# Patient Record
Sex: Female | Born: 2000 | Race: White | Hispanic: No | Marital: Single | Smoking: Never smoker
Health system: Southern US, Community
[De-identification: ages and names within clinical notes are randomized; demographics above are authoritative.]

## PROBLEM LIST (undated history)

## (undated) DIAGNOSIS — F938 Other childhood emotional disorders: Secondary | ICD-10-CM

## (undated) DIAGNOSIS — F909 Attention-deficit hyperactivity disorder, unspecified type: Secondary | ICD-10-CM

## (undated) HISTORY — PX: MOUTH SURGERY: SHX715

---
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
## 2018-08-01 DIAGNOSIS — F902 Attention-deficit hyperactivity disorder, combined type: Secondary | ICD-10-CM | POA: Diagnosis not present

## 2018-09-18 DIAGNOSIS — F902 Attention-deficit hyperactivity disorder, combined type: Secondary | ICD-10-CM | POA: Diagnosis not present

## 2018-10-24 ENCOUNTER — Other Ambulatory Visit: Payer: Self-pay

## 2018-10-24 DIAGNOSIS — Z20822 Contact with and (suspected) exposure to covid-19: Secondary | ICD-10-CM

## 2018-10-25 LAB — NOVEL CORONAVIRUS, NAA: SARS-CoV-2, NAA: NOT DETECTED

## 2018-10-25 LAB — SPECIMEN STATUS REPORT

## 2019-07-03 ENCOUNTER — Other Ambulatory Visit: Payer: Self-pay | Admitting: Obstetrics and Gynecology

## 2019-07-03 DIAGNOSIS — N6452 Nipple discharge: Secondary | ICD-10-CM

## 2019-07-16 ENCOUNTER — Ambulatory Visit
Admission: RE | Admit: 2019-07-16 | Discharge: 2019-07-16 | Disposition: A | Discharge status: Home / Self Care | Payer: BC Managed Care – PPO | Source: Ambulatory Visit | Attending: Obstetrics and Gynecology | Admitting: Obstetrics and Gynecology

## 2019-07-16 ENCOUNTER — Other Ambulatory Visit: Payer: Self-pay | Admitting: Obstetrics and Gynecology

## 2019-07-16 ENCOUNTER — Other Ambulatory Visit: Payer: Self-pay

## 2019-07-16 DIAGNOSIS — N6452 Nipple discharge: Secondary | ICD-10-CM

## 2019-07-18 ENCOUNTER — Other Ambulatory Visit: Payer: Self-pay | Admitting: Obstetrics and Gynecology

## 2019-07-18 ENCOUNTER — Other Ambulatory Visit: Payer: Self-pay

## 2019-07-18 DIAGNOSIS — N6452 Nipple discharge: Secondary | ICD-10-CM

## 2020-02-24 ENCOUNTER — Other Ambulatory Visit: Payer: BC Managed Care – PPO

## 2020-02-24 DIAGNOSIS — Z20822 Contact with and (suspected) exposure to covid-19: Secondary | ICD-10-CM

## 2020-02-26 LAB — SARS-COV-2, NAA 2 DAY TAT

## 2020-02-26 LAB — NOVEL CORONAVIRUS, NAA: SARS-CoV-2, NAA: DETECTED — AB

## 2020-10-27 IMAGING — US US BREAST*L* LIMITED INC AXILLA
1 series · 2 of 2 positions shown · non-contrast
Comparison: None.
COMPARISON: None.

Addendum:
CLINICAL DATA: Patient presents for unilateral left spontaneous
clear nipple discharge for the past 2 weeks.

EXAM:
DIGITAL DIAGNOSTIC BILATERAL MAMMOGRAM WITH CAD AND TOMO
ULTRASOUND LEFT BREAST

[Series 1: us breast*left* limited inc axilla · 0.06mm/px · 2 of 2 slices shown]
[im 1/2]
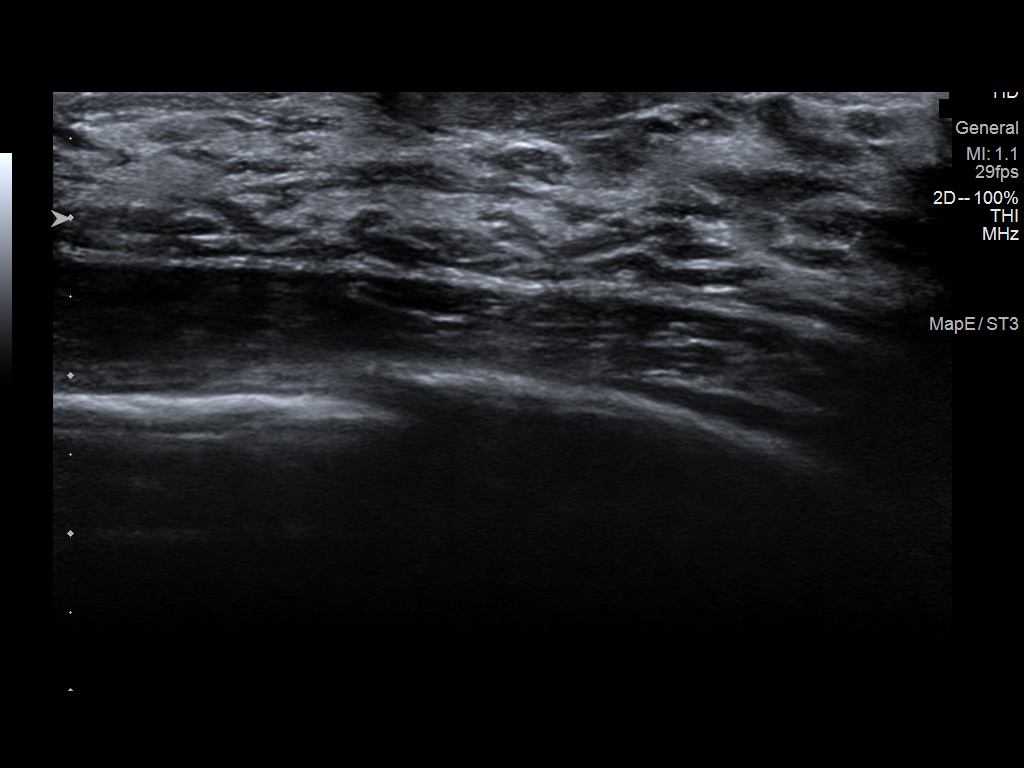
[im 2/2]
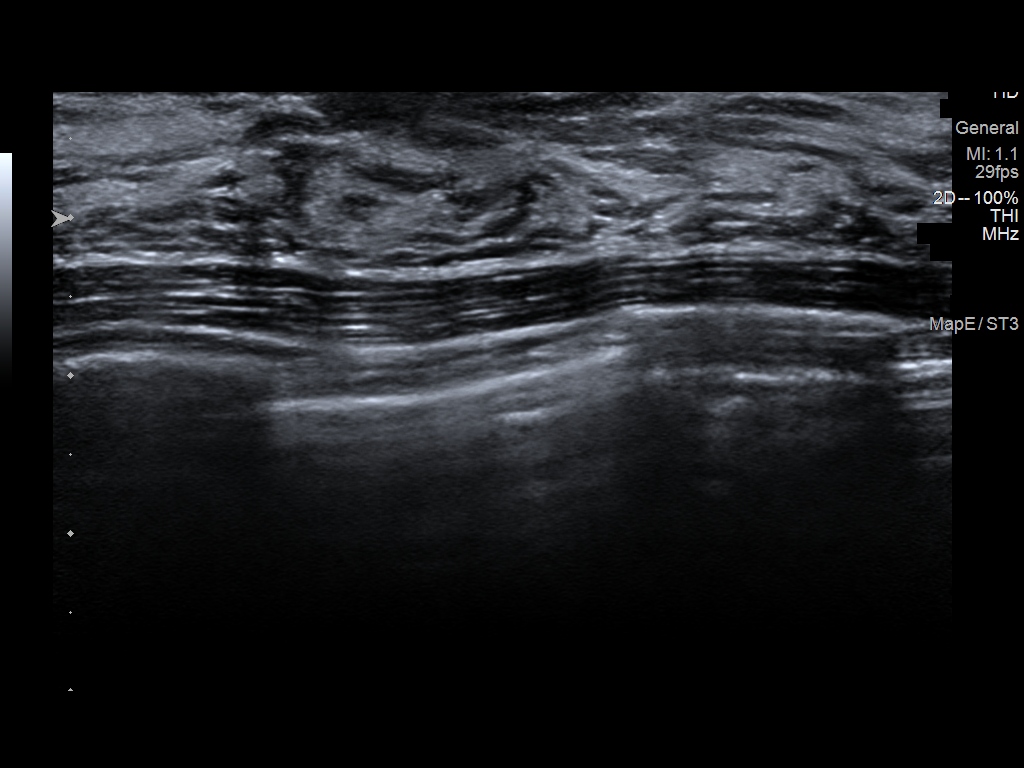

[2 of 2 positions shown; findings below may reference images not displayed]

ACR Breast Density Category d: The breast tissue is extremely dense,
which lowers the sensitivity of mammography.
FINDINGS: No concerning masses, calcifications or distortion identified within
either breast.

Mammographic images were processed with CAD.

On physical exam, there is a small amount of clear discharge
elicited with palpation from a single duct within the left nipple.

Targeted ultrasound is performed, showing normal retroareolar
tissues within the left breast. No suspicious retroareolar mass or
dilated ducts identified.
IMPRESSION: No mammographic evidence for malignancy.

Indeterminate suspicious spontaneous clear left nipple discharge.

No findings within the retroareolar left breast on ultrasound to
explain spontaneous clear nipple discharge.

RECOMMENDATION:
Bilateral breast MRI and surgical consultation for further clinical
evaluation of indeterminate clear spontaneous left nipple discharge.

I have discussed the findings and recommendations with the patient.
If applicable, a reminder letter will be sent to the patient
regarding the next appointment.

BI-RADS CATEGORY  2: Benign.

ADDENDUM:
Surgical consultation has been arranged with Dr. Salaj Perlesha at
[REDACTED] on August 06, 2019.

Bilateral breast MRI recommended for further clinical evaluation of
indeterminate clear spontaneous LEFT nipple discharge.

Biken Duenas RN on 07/17/2019

*** End of Addendum ***
ACR Breast Density Category d: The breast tissue is extremely dense,
which lowers the sensitivity of mammography.
FINDINGS: No concerning masses, calcifications or distortion identified within
either breast.

Mammographic images were processed with CAD.

On physical exam, there is a small amount of clear discharge
elicited with palpation from a single duct within the left nipple.

Targeted ultrasound is performed, showing normal retroareolar
tissues within the left breast. No suspicious retroareolar mass or
dilated ducts identified.
IMPRESSION: No mammographic evidence for malignancy.

Indeterminate suspicious spontaneous clear left nipple discharge.

No findings within the retroareolar left breast on ultrasound to
explain spontaneous clear nipple discharge.

RECOMMENDATION:
Bilateral breast MRI and surgical consultation for further clinical
evaluation of indeterminate clear spontaneous left nipple discharge.

I have discussed the findings and recommendations with the patient.
If applicable, a reminder letter will be sent to the patient
regarding the next appointment.

BI-RADS CATEGORY  2: Benign.

## 2020-10-27 IMAGING — MG DIGITAL DIAGNOSTIC BILAT W/ TOMO W/ CAD
6 of 10 series · 6 of 30 positions shown · non-contrast
Comparison: None.
COMPARISON: None.

Addendum:
CLINICAL DATA: Patient presents for unilateral left spontaneous
clear nipple discharge for the past 2 weeks.

EXAM:
DIGITAL DIAGNOSTIC BILATERAL MAMMOGRAM WITH CAD AND TOMO
ULTRASOUND LEFT BREAST

[L MLO synth-2D (1 of 2)]
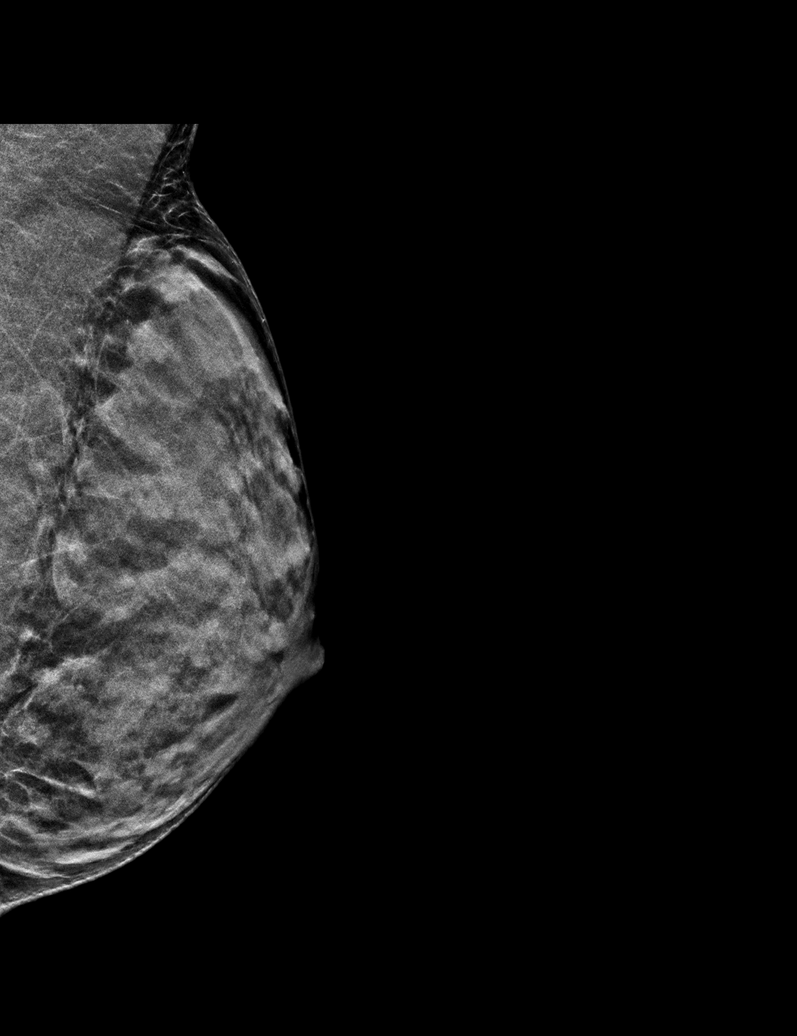

[R MLO synth-2D]
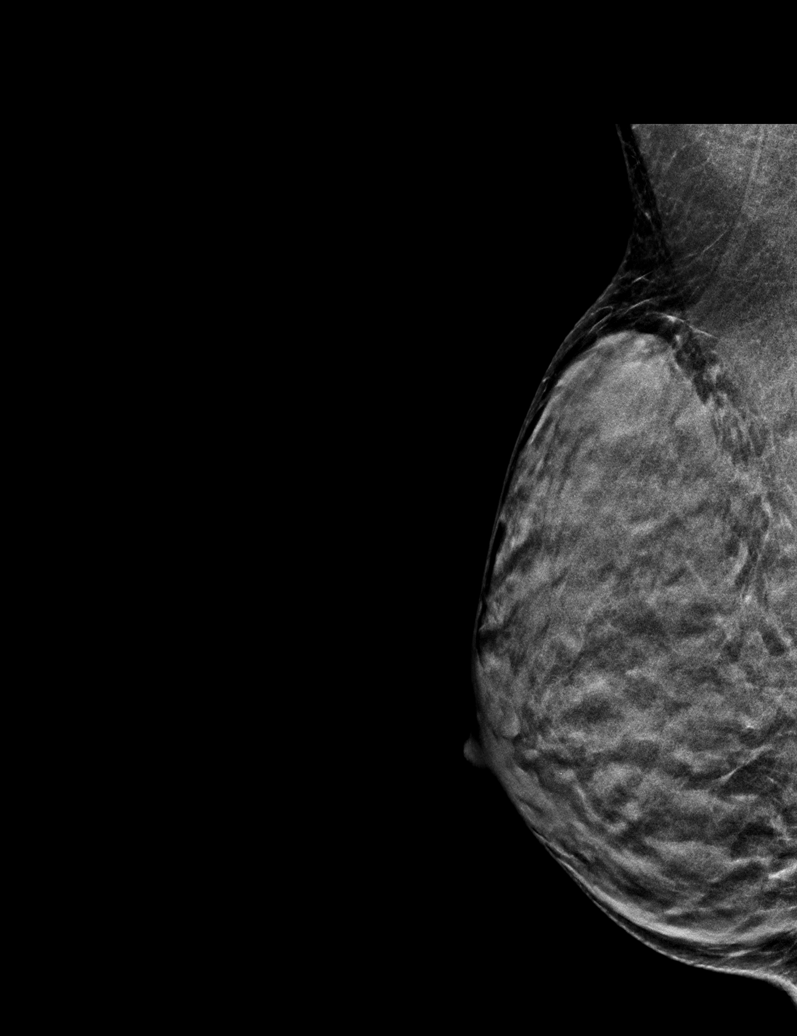

[L CC synth-2D]
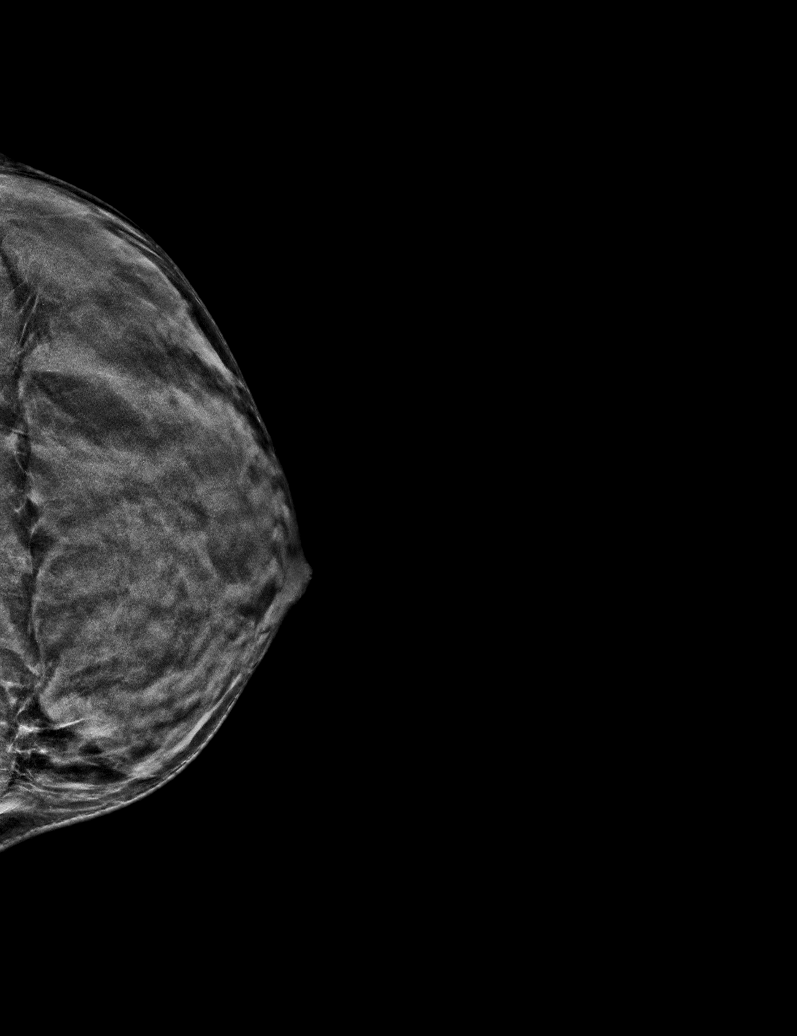

[L MLO synth-2D (2 of 2)]
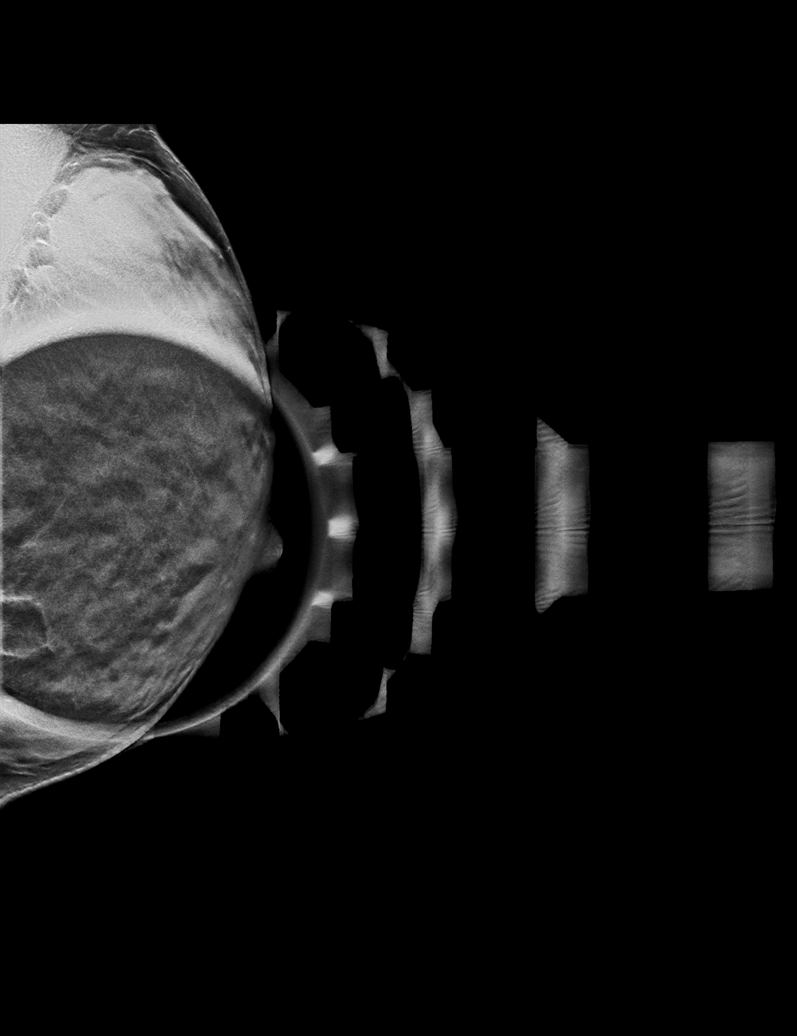

[R CC synth-2D]
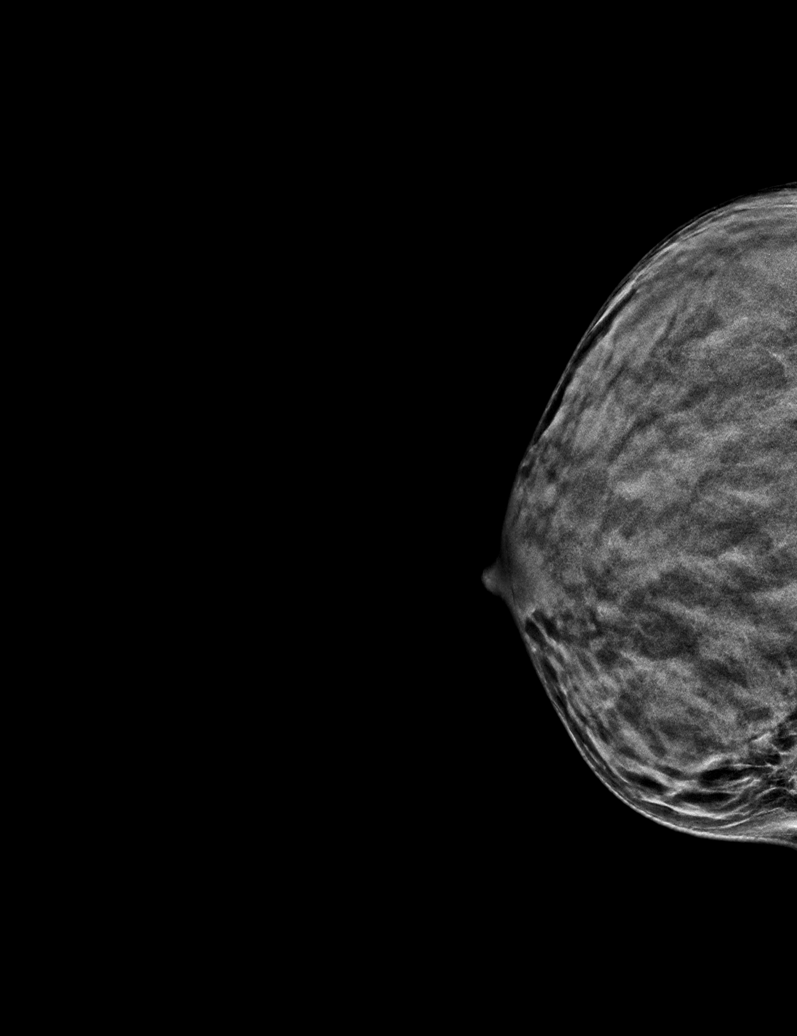

[L CC tomo · tomo slice 21/40.0]
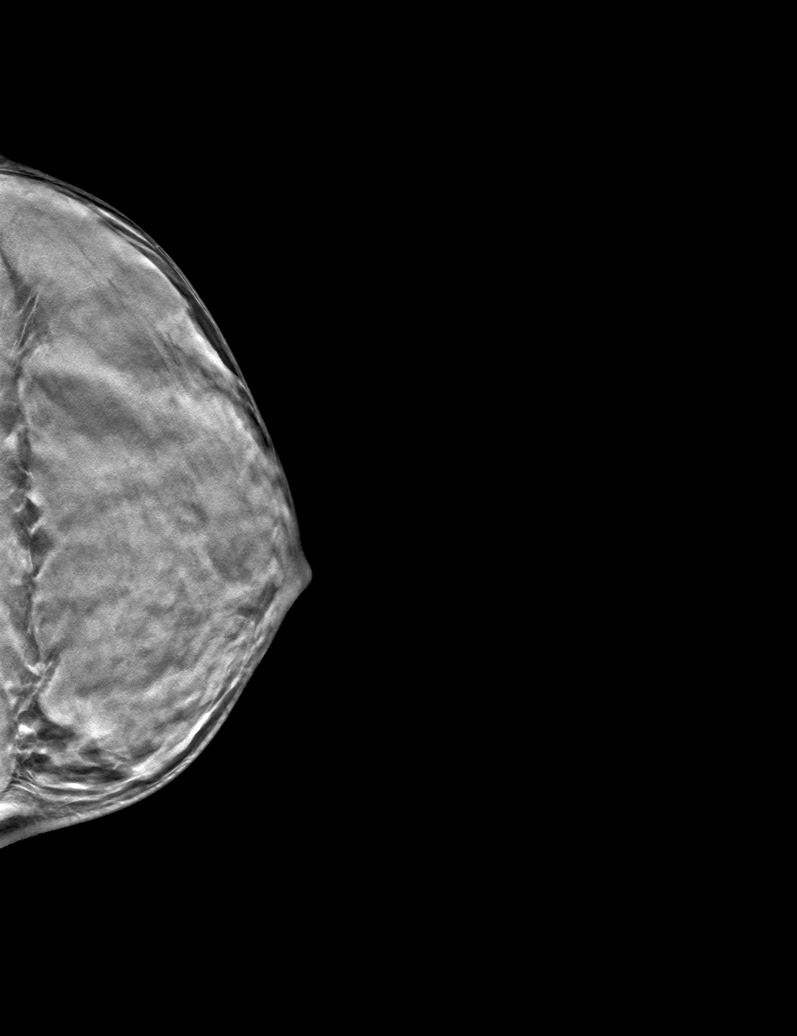

[6 of 30 positions shown; findings below may reference images not displayed]

ACR Breast Density Category d: The breast tissue is extremely dense,
which lowers the sensitivity of mammography.
FINDINGS: No concerning masses, calcifications or distortion identified within
either breast.

Mammographic images were processed with CAD.

On physical exam, there is a small amount of clear discharge
elicited with palpation from a single duct within the left nipple.

Targeted ultrasound is performed, showing normal retroareolar
tissues within the left breast. No suspicious retroareolar mass or
dilated ducts identified.
IMPRESSION: No mammographic evidence for malignancy.

Indeterminate suspicious spontaneous clear left nipple discharge.

No findings within the retroareolar left breast on ultrasound to
explain spontaneous clear nipple discharge.

RECOMMENDATION:
Bilateral breast MRI and surgical consultation for further clinical
evaluation of indeterminate clear spontaneous left nipple discharge.

I have discussed the findings and recommendations with the patient.
If applicable, a reminder letter will be sent to the patient
regarding the next appointment.

BI-RADS CATEGORY  2: Benign.

ADDENDUM:
Surgical consultation has been arranged with Dr. Salaj Perlesha at
[REDACTED] on August 06, 2019.

Bilateral breast MRI recommended for further clinical evaluation of
indeterminate clear spontaneous LEFT nipple discharge.

Biken Duenas RN on 07/17/2019

*** End of Addendum ***
ACR Breast Density Category d: The breast tissue is extremely dense,
which lowers the sensitivity of mammography.
FINDINGS: No concerning masses, calcifications or distortion identified within
either breast.

Mammographic images were processed with CAD.

On physical exam, there is a small amount of clear discharge
elicited with palpation from a single duct within the left nipple.

Targeted ultrasound is performed, showing normal retroareolar
tissues within the left breast. No suspicious retroareolar mass or
dilated ducts identified.
IMPRESSION: No mammographic evidence for malignancy.

Indeterminate suspicious spontaneous clear left nipple discharge.

No findings within the retroareolar left breast on ultrasound to
explain spontaneous clear nipple discharge.

RECOMMENDATION:
Bilateral breast MRI and surgical consultation for further clinical
evaluation of indeterminate clear spontaneous left nipple discharge.

I have discussed the findings and recommendations with the patient.
If applicable, a reminder letter will be sent to the patient
regarding the next appointment.

BI-RADS CATEGORY  2: Benign.

## 2021-12-22 ENCOUNTER — Other Ambulatory Visit (HOSPITAL_COMMUNITY): Payer: Self-pay | Admitting: Obstetrics and Gynecology

## 2021-12-22 DIAGNOSIS — O034 Incomplete spontaneous abortion without complication: Secondary | ICD-10-CM

## 2021-12-24 ENCOUNTER — Encounter (HOSPITAL_COMMUNITY): Payer: Self-pay | Admitting: Obstetrics and Gynecology

## 2021-12-24 NOTE — H&P (Addendum)
Mary Gonzales is an 21 y.o. female. Presenting with retained products of conception.  She has pregnancy diagnosed in our office on 11/30/21 with IUGS and YS. She underwent pregnancy termination at [facility] choice with pills rx-ed by then. She came to my office a few days after for [place] to confirm completion. It showed suspicion for rPOC. [place] was then repeated a week later and confirmed rPOCs with increased BF.  She has been on pelvic rest.   Pertinent Gynecological History:  OB History: G1, P0010  Patient's last menstrual period was 09/28/2021 (approximate).    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 No pertinent family history.  Social History:  reports that she has never smoked. She has never used smokeless tobacco. She reports current alcohol use of about 2.0 standard drinks of alcohol per week. She reports that she does not use drugs.  Allergies: No Known Allergies  No medications prior to admission.    Review of Systems  Height 5\' 6"  (1.676 m), weight 52.2 kg, last menstrual period 09/28/2021. Physical Exam Gen: well appearing, NAD CV: Reg rate Pulm: NWOB Abd: soft, nondistended, nontender, no masses GYN: uterus 6 week size, no adnexa ttp/CMT Ext: No edema b/l  No results found for this or any previous visit (from the past 24 hour(s)).  No results found.  Assessment/Plan: Pt counseled on TVUS demonstrating rPOCs.  Patient desires definitive surgical management with suction dilation and curettage.   Risks/benefits/ and alternatives reviewed with patient with risks including but not limited to bleeding, infection, uterine perforation, and damage to nearby structures such as the bowel, bladder, vessels, and/or other organs. She was given opportunity to ask  questions and all questions answered. Patient consents to proceed with suction dilation and curettage. Reviewed bleeding precautions. Her blood type is O+ and she does not need Rhogam. Given instructions and she verbalizes understanding. Discussed the importance of having at least one normal periods after completion of the process, before attempting conception again. Discussed S/S to call back for. All questions answered and pt verbalizes understanding w/out further questions/concerns. Risks discussed including infection, bleeding, damage to surrounding structures, need for additional procedures, postoperative DVT and subsequent scarring. All questions answered. Consent signed in office.  Plan on doxycycline postop and consider cytotec x 3 days.    Tyson Dense 12/24/2021, 12:33 PM

## 2021-12-24 NOTE — Progress Notes (Signed)
PCP - Dr Pershing Cox Cardiologist - n/a  Chest x-ray - n/a EKG - n/a Stress Test - n/a ECHO - n/a Cardiac Cath - n/a  ICD Pacemaker/Loop - n/a  Sleep Study -  n/a CPAP - none  STOP now taking any Aspirin (unless otherwise instructed by your surgeon), Aleve, Naproxen, Ibuprofen, Motrin, Advil, Goody's, BC's, all herbal medications, fish oil, and all vitamins.   Coronavirus Screening Do you have any of the following symptoms:  Cough yes/no: No Fever (>100.81F)  yes/no: No Runny nose yes/no: No Sore throat yes/no: No Difficulty breathing/shortness of breath  yes/no: No  Have you traveled in the last 14 days and where? yes/no: No  Patient verbalized understanding of instructions that were given via phone

## 2021-12-26 NOTE — Anesthesia Preprocedure Evaluation (Signed)
Anesthesia Evaluation  Patient identified by MRN, date of birth, ID band Patient awake    Reviewed: Allergy & Precautions, NPO status , Patient's Chart, lab work & pertinent test results  Airway Mallampati: I  TM Distance: >3 FB Neck ROM: Full    Dental no notable dental hx. (+) Dental Advisory Given, Teeth Intact   Pulmonary neg pulmonary ROS, Patient abstained from smoking.   Pulmonary exam normal breath sounds clear to auscultation       Cardiovascular negative cardio ROS Normal cardiovascular exam Rhythm:Regular Rate:Normal     Neuro/Psych  PSYCHIATRIC DISORDERS Anxiety Depression    negative neurological ROS     GI/Hepatic negative GI ROS, Neg liver ROS,,,  Endo/Other  negative endocrine ROS    Renal/GU negative Renal ROS     Musculoskeletal negative musculoskeletal ROS (+)    Abdominal   Peds  Hematology negative hematology ROS (+)   Anesthesia Other Findings   Reproductive/Obstetrics                             Anesthesia Physical Anesthesia Plan  ASA: 1  Anesthesia Plan: General   Post-op Pain Management: Tylenol PO (pre-op)* and Toradol IV (intra-op)*   Induction: Intravenous  PONV Risk Score and Plan: 4 or greater and Ondansetron, Dexamethasone, Midazolam, Treatment may vary due to age or medical condition and Scopolamine patch - Pre-op  Airway Management Planned: LMA  Additional Equipment:   Intra-op Plan:   Post-operative Plan: Extubation in OR  Informed Consent: I have reviewed the patients History and Physical, chart, labs and discussed the procedure including the risks, benefits and alternatives for the proposed anesthesia with the patient or authorized representative who has indicated his/her understanding and acceptance.     Dental advisory given  Plan Discussed with: CRNA  Anesthesia Plan Comments:         Anesthesia Quick Evaluation

## 2021-12-27 ENCOUNTER — Ambulatory Visit (HOSPITAL_COMMUNITY): Payer: BC Managed Care – PPO | Admitting: Anesthesiology

## 2021-12-27 ENCOUNTER — Ambulatory Visit (HOSPITAL_COMMUNITY)
Admission: RE | Admit: 2021-12-27 | Discharge: 2021-12-27 | Disposition: A | Discharge status: Home / Self Care | Payer: BC Managed Care – PPO | Attending: Obstetrics and Gynecology | Admitting: Obstetrics and Gynecology

## 2021-12-27 ENCOUNTER — Ambulatory Visit (HOSPITAL_COMMUNITY)
Admission: RE | Admit: 2021-12-27 | Discharge: 2021-12-27 | Disposition: A | Discharge status: Home / Self Care | Payer: BC Managed Care – PPO | Source: Ambulatory Visit | Attending: Obstetrics and Gynecology | Admitting: Obstetrics and Gynecology

## 2021-12-27 ENCOUNTER — Encounter (HOSPITAL_COMMUNITY)
Admission: RE | Disposition: A | Discharge status: Home / Self Care | Payer: Self-pay | Source: Home / Self Care | Attending: Obstetrics and Gynecology

## 2021-12-27 ENCOUNTER — Other Ambulatory Visit: Payer: Self-pay

## 2021-12-27 ENCOUNTER — Encounter (HOSPITAL_COMMUNITY): Payer: Self-pay | Admitting: Obstetrics and Gynecology

## 2021-12-27 DIAGNOSIS — O034 Incomplete spontaneous abortion without complication: Secondary | ICD-10-CM

## 2021-12-27 DIAGNOSIS — O074 Failed attempted termination of pregnancy without complication: Secondary | ICD-10-CM | POA: Diagnosis present

## 2021-12-27 HISTORY — PX: OPERATIVE ULTRASOUND: SHX5996

## 2021-12-27 HISTORY — PX: DILATION AND EVACUATION: SHX1459

## 2021-12-27 HISTORY — DX: Attention-deficit hyperactivity disorder, unspecified type: F90.9

## 2021-12-27 LAB — CBC
HCT: 40.4 % (ref 36.0–46.0)
Hemoglobin: 13.1 g/dL (ref 12.0–15.0)
MCH: 30.8 pg (ref 26.0–34.0)
MCHC: 32.4 g/dL (ref 30.0–36.0)
MCV: 95.1 fL (ref 80.0–100.0)
Platelets: 276 10*3/uL (ref 150–400)
RBC: 4.25 MIL/uL (ref 3.87–5.11)
RDW: 13.3 % (ref 11.5–15.5)
WBC: 6.8 10*3/uL (ref 4.0–10.5)
nRBC: 0 % (ref 0.0–0.2)

## 2021-12-27 LAB — TYPE AND SCREEN
ABO/RH(D): O POS
Antibody Screen: NEGATIVE

## 2021-12-27 LAB — ABO/RH: ABO/RH(D): O POS

## 2021-12-27 SURGERY — DILATION AND EVACUATION, UTERUS
Anesthesia: General | Site: Uterus

## 2021-12-27 MED ORDER — POVIDONE-IODINE 10 % EX SWAB: 2.0000 | Freq: Once | CUTANEOUS | Status: DC

## 2021-12-27 MED ORDER — AMISULPRIDE (ANTIEMETIC) 5 MG/2ML IV SOLN: 10.0000 mg | Freq: Once | INTRAVENOUS | Status: DC | PRN

## 2021-12-27 MED ORDER — BUPIVACAINE-EPINEPHRINE (PF) 0.25% -1:200000 IJ SOLN
INTRAMUSCULAR | Status: DC | PRN
  Administered 2021-12-27: 8 mL

## 2021-12-27 MED ORDER — PROPOFOL 1000 MG/100ML IV EMUL
INTRAVENOUS | Status: AC
  Filled 2021-12-27: qty 100

## 2021-12-27 MED ORDER — HYDROMORPHONE HCL 1 MG/ML IJ SOLN: 0.2500 mg | INTRAMUSCULAR | Status: DC | PRN

## 2021-12-27 MED ORDER — PROPOFOL 10 MG/ML IV BOLUS
INTRAVENOUS | Status: AC
  Filled 2021-12-27: qty 20

## 2021-12-27 MED ORDER — FENTANYL CITRATE (PF) 250 MCG/5ML IJ SOLN
INTRAMUSCULAR | Status: AC
  Filled 2021-12-27: qty 5

## 2021-12-27 MED ORDER — OXYCODONE HCL 5 MG PO TABS
ORAL_TABLET | ORAL | Status: AC
  Administered 2021-12-27: 5 mg via ORAL
  Filled 2021-12-27: qty 1

## 2021-12-27 MED ORDER — CHLORHEXIDINE GLUCONATE 0.12 % MT SOLN
OROMUCOSAL | Status: AC
  Administered 2021-12-27: 15 mL via OROMUCOSAL
  Filled 2021-12-27: qty 15

## 2021-12-27 MED ORDER — PROMETHAZINE HCL 25 MG/ML IJ SOLN: 6.2500 mg | INTRAMUSCULAR | Status: DC | PRN

## 2021-12-27 MED ORDER — OXYCODONE HCL 5 MG PO TABS: 5.0000 mg | ORAL_TABLET | Freq: Once | ORAL | Status: AC | PRN

## 2021-12-27 MED ORDER — ACETAMINOPHEN 500 MG PO TABS
ORAL_TABLET | ORAL | Status: AC
  Administered 2021-12-27: 1000 mg via ORAL
  Filled 2021-12-27: qty 2

## 2021-12-27 MED ORDER — MEPERIDINE HCL 25 MG/ML IJ SOLN: 6.2500 mg | INTRAMUSCULAR | Status: DC | PRN

## 2021-12-27 MED ORDER — PROPOFOL 10 MG/ML IV BOLUS
INTRAVENOUS | Status: DC | PRN
  Administered 2021-12-27: 150 mg via INTRAVENOUS
  Administered 2021-12-27 (×2): 50 mg via INTRAVENOUS

## 2021-12-27 MED ORDER — MIDAZOLAM HCL 2 MG/2ML IJ SOLN
INTRAMUSCULAR | Status: AC
  Filled 2021-12-27: qty 2

## 2021-12-27 MED ORDER — LACTATED RINGERS IV SOLN: INTRAVENOUS | Status: DC

## 2021-12-27 MED ORDER — FENTANYL CITRATE (PF) 250 MCG/5ML IJ SOLN
INTRAMUSCULAR | Status: DC | PRN
  Administered 2021-12-27 (×2): 25 ug via INTRAVENOUS

## 2021-12-27 MED ORDER — OXYCODONE HCL 5 MG/5ML PO SOLN: 5.0000 mg | Freq: Once | ORAL | Status: AC | PRN

## 2021-12-27 MED ORDER — MIDAZOLAM HCL 5 MG/5ML IJ SOLN
INTRAMUSCULAR | Status: DC | PRN
  Administered 2021-12-27: 4 mg via INTRAVENOUS

## 2021-12-27 MED ORDER — 0.9 % SODIUM CHLORIDE (POUR BTL) OPTIME
TOPICAL | Status: DC | PRN
  Administered 2021-12-27: 1000 mL

## 2021-12-27 MED ORDER — LIDOCAINE HCL (CARDIAC) PF 100 MG/5ML IV SOSY
PREFILLED_SYRINGE | INTRAVENOUS | Status: DC | PRN
  Administered 2021-12-27: 50 mg via INTRATRACHEAL

## 2021-12-27 MED ORDER — SCOPOLAMINE 1 MG/3DAYS TD PT72
MEDICATED_PATCH | TRANSDERMAL | Status: AC
  Administered 2021-12-27: 1.5 mg via TRANSDERMAL
  Filled 2021-12-27: qty 1

## 2021-12-27 MED ORDER — SCOPOLAMINE 1 MG/3DAYS TD PT72: 1.0000 | MEDICATED_PATCH | TRANSDERMAL | Status: DC

## 2021-12-27 MED ORDER — ACETAMINOPHEN 500 MG PO TABS: 1000.0000 mg | ORAL_TABLET | Freq: Once | ORAL | Status: AC

## 2021-12-27 MED ORDER — BUPIVACAINE HCL (PF) 0.25 % IJ SOLN
INTRAMUSCULAR | Status: AC
  Filled 2021-12-27: qty 10

## 2021-12-27 MED ORDER — CHLORHEXIDINE GLUCONATE 0.12 % MT SOLN
15.0000 mL | OROMUCOSAL | Status: AC
  Filled 2021-12-27: qty 15

## 2021-12-27 SURGICAL SUPPLY — 21 items
CATH ROBINSON RED A/P 16FR (CATHETERS) ×2 IMPLANT
DRSG TELFA 3X8 NADH STRL (GAUZE/BANDAGES/DRESSINGS) ×2 IMPLANT
FILTER UTR ASPR ASSEMBLY (MISCELLANEOUS) ×2 IMPLANT
GLOVE BIO SURGEON STRL SZ 6.5 (GLOVE) ×2 IMPLANT
GLOVE BIOGEL PI IND STRL 7.0 (GLOVE) ×4 IMPLANT
GOWN STRL REUS W/ TWL LRG LVL3 (GOWN DISPOSABLE) ×4 IMPLANT
GOWN STRL REUS W/TWL LRG LVL3 (GOWN DISPOSABLE) ×4
HOSE CONNECTING 18IN BERKELEY (TUBING) ×2 IMPLANT
KIT BERKELEY 1ST TRI 3/8 NO TR (MISCELLANEOUS) ×2 IMPLANT
KIT BERKELEY 1ST TRIMESTER 3/8 (MISCELLANEOUS) ×2 IMPLANT
NS IRRIG 1000ML POUR BTL (IV SOLUTION) ×2 IMPLANT
PACK VAGINAL MINOR WOMEN LF (CUSTOM PROCEDURE TRAY) ×2 IMPLANT
PAD OB MATERNITY 4.3X12.25 (PERSONAL CARE ITEMS) ×2 IMPLANT
SET BERKELEY SUCTION TUBING (SUCTIONS) ×2 IMPLANT
SPIKE FLUID TRANSFER (MISCELLANEOUS) ×2 IMPLANT
TOWEL GREEN STERILE FF (TOWEL DISPOSABLE) ×4 IMPLANT
UNDERPAD 30X36 HEAVY ABSORB (UNDERPADS AND DIAPERS) ×2 IMPLANT
VACURETTE 10 RIGID CVD (CANNULA) IMPLANT
VACURETTE 7MM CVD STRL WRAP (CANNULA) IMPLANT
VACURETTE 8 RIGID CVD (CANNULA) IMPLANT
VACURETTE 9 RIGID CVD (CANNULA) IMPLANT

## 2021-12-27 NOTE — Anesthesia Postprocedure Evaluation (Signed)
Anesthesia Post Note  Patient: Mary Gonzales  Procedure(s) Performed: DILATATION AND EVACUATION (Uterus) OPERATIVE ULTRASOUND (Abdomen)     Patient location during evaluation: PACU Anesthesia Type: General Level of consciousness: sedated and patient cooperative Pain management: pain level controlled Vital Signs Assessment: post-procedure vital signs reviewed and stable Respiratory status: spontaneous breathing Cardiovascular status: stable Anesthetic complications: no   No notable events documented.  Last Vitals:  Vitals:   12/27/21 0900 12/27/21 0915  BP: 137/88 (!) 138/99  Pulse: (!) 48 (!) 50  Resp: 12 17  Temp: 36.4 C   SpO2: 100% 99%    Last Pain:  Vitals:   12/27/21 0915  TempSrc:   PainSc: [place]

## 2021-12-27 NOTE — Progress Notes (Signed)
No updates to above H&P. Patient arrived NPO and was consented in PACU. Risks again discussed, all questions answered, and consent signed. Proceed with surgery.    Lavaeh Bau MD  

## 2021-12-27 NOTE — Op Note (Signed)
PREOPERATIVE DIAGNOSES: Retained POCs in first trimester  POSTOPERATIVE DIAGNOSES: Same  PROCEDURE PERFORMED: Dilation, suction, sharp curretage  SURGEON: Dr. Lucillie Garfinkel  ANESTHESIA: Paracervical block and IV sedation  ESTIMATED BLOOD LOSS: 5 cc.  COMPLICATIONS: None  TUBES: None.  DRAINS: None  PATHOLOGY: Endometrial curettings  FINDINGS: On exam, under anesthesia, normal appearing vulva and vagina, 6 week sized uterus  Operative findings demonstrated plethora of rPOC  Procedure: The patient was taken to the operating room where she was properly prepped and draped in sterile manner under general anesthesia. After bimanual examination, the cervix was exposed with a speculum and the anterior lip of the cervix grasped with a tenaculum. Paracervical block performed. The endocervical canal was then progressively dilated to 67mm. Suction catheter was introduced into the uterus and to the uterine fundus under US guidance The uterus was evacuated and good tissue return was noted. A sharp curettage was then performed until gritty texture noted. US guidance confirmed empty uterus. All instruments were removed from vagina. The sponge and lap counts were correct times 2 at this time. The patient's procedure was terminated. We then awakened her. She was sent to the Recovery Room in good condition.    Lucillie Garfinkel MD

## 2021-12-27 NOTE — Transfer of Care (Signed)
Immediate Anesthesia Transfer of Care Note  Patient: Mary Gonzales  Procedure(s) Performed: DILATATION AND EVACUATION (Uterus) OPERATIVE ULTRASOUND (Abdomen)  Patient Location: PACU  Anesthesia Type:General  Level of Consciousness: awake  Airway & Oxygen Therapy: Patient Spontanous Breathing and Patient connected to face mask  Post-op Assessment: Report given to RN, Post -op Vital signs reviewed and stable, and Patient moving all extremities  Post vital signs: Reviewed and stable  Last Vitals:  Vitals Value Taken Time  BP 103/78 12/27/21 0817  Temp    Pulse 43 12/27/21 0820  Resp 20 12/27/21 0820  SpO2 100 % 12/27/21 0820  Vitals shown include unvalidated device data.  Last Pain:  Vitals:   12/27/21 0621  TempSrc:   PainSc: 0-No pain      Patients Stated Pain Goal: 0 (66/44/03 4742)  Complications: No notable events documented.

## 2021-12-28 ENCOUNTER — Encounter (HOSPITAL_COMMUNITY): Payer: Self-pay | Admitting: Obstetrics and Gynecology

## 2021-12-28 LAB — SURGICAL PATHOLOGY

## 2022-02-09 ENCOUNTER — Ambulatory Visit: Payer: BC Managed Care – PPO | Admitting: Orthopaedic Surgery

## 2022-02-10 ENCOUNTER — Ambulatory Visit (INDEPENDENT_AMBULATORY_CARE_PROVIDER_SITE_OTHER): Payer: BC Managed Care – PPO | Admitting: Physician Assistant

## 2022-02-10 ENCOUNTER — Encounter: Payer: Self-pay | Admitting: Physician Assistant

## 2022-02-10 DIAGNOSIS — M25521 Pain in right elbow: Secondary | ICD-10-CM | POA: Diagnosis not present

## 2022-02-10 DIAGNOSIS — M25522 Pain in left elbow: Secondary | ICD-10-CM

## 2022-02-10 NOTE — Progress Notes (Signed)
Office Visit Note   Patient: Mary Gonzales           Date of Birth: 2001/02/12           MRN: [id number] Visit Date: 02/10/2022              Requested by: Mary Joe, MD [phone] Mary [address] [place],  [place] [address] PCP: Mary Joe, MD  Chief Complaint  Patient presents with   Left Elbow - Pain      HPI: Mary Gonzales is a pleasant 21 year old woman with a 5-day history of left elbow pain.  She was skiing with her friends when she fell onto her outstretched arm onto her elbow.  She had immediate pain in her elbow and forearm.  She was seen and evaluated at [facility] where she was placed in a splint and x-rays were taken.  She is here to be evaluated and followed  Assessment & Plan: Visit Diagnoses: Left elbow pain   Plan: X-rays were reviewed on disc.  Do not see any obvious fracture.  She does have a positive stat fat pad sign.  She will remain in a sling for the next week.  We will reimage her at that time.  If there is any concerns would order a CAT scan at that time.  Did counsel her about removing her ring on her finger as her hand has some swelling.  She said the swelling is less and she would prefer not to take the ring off but will be mindful of this  Follow-Up Instructions: Return in about 1 week (around 02/17/2022).   Ortho Exam  Patient is alert, oriented, no adenopathy, well-dressed, normal affect, normal respiratory effort. Examination of her left elbow she has swelling in her left elbow but no ecchymosis she is neurovascularly intact good radial pulse she has sensation in all of her fingers with brisk capillary refill.  No tenderness to deep palpation over the ulnar and radial shaft.  She does have tenderness over the olecranon.  Can almost fully extend her arm without pain.  Does have pain elicited with supination and pronation no tenderness at the wrist  Imaging: No results found. No images are attached to the encounter.  Labs: No results found for:  "HGBA1C", "ESRSEDRATE", "CRP", "LABURIC", "REPTSTATUS", "GRAMSTAIN", "CULT", "LABORGA"   Lab Results  Component Value Date   ALBUMIN 4.6 04/19/2016    No results found for: "MG" No results found for: "VD25OH"  No results found for: "PREALBUMIN"    Latest Ref Rng & Units 12/27/2021    6:14 AM 08/06/2016   12:00 AM 04/22/2016    6:45 AM  CBC EXTENDED  WBC 4.0 - 10.5 K/uL 6.8   5.6   RBC 3.87 - 5.11 MIL/uL 4.25   4.08   Hemoglobin 12.0 - 15.0 g/dL 88.4  16.6  9.9   HCT 06.3 - 46.0 % 40.4  36.0  31.6   Platelets 150 - 400 K/uL 276   259   NEUT# 1.7 - 7.7 K/uL   2.8   Lymph# 0.7 - 4.0 K/uL   1.9      There is no height or weight on file to calculate BMI.  Orders:  No orders of the defined types were placed in this encounter.  No orders of the defined types were placed in this encounter.    Procedures: No procedures performed  Clinical Data: No additional findings.  ROS:  All other systems negative, except as noted in the  HPI. Review of Systems  Objective: Vital Signs: There were no vitals taken for this visit.  Specialty Comments:  No specialty comments available.  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 No pertinent family history.  Past Surgical History:  Procedure Laterality Date   DILATION AND EVACUATION N/A 12/27/2021   Procedure: DILATATION AND EVACUATION;  Surgeon: Tyson Dense, MD;  Location: [place];  Service: Gynecology;  Laterality: N/A;   MOUTH SURGERY     with sedation - front teeth   OPERATIVE  ULTRASOUND N/A 12/27/2021   Procedure: OPERATIVE ULTRASOUND;  Surgeon: Tyson Dense, MD;  Location: [place];  Service: Gynecology;  Laterality: N/A;   Social History   Occupational History   Not on file  Tobacco Use   Smoking status: Never   Smokeless tobacco: Never  Vaping Use   Vaping Use: Some days   Substances: Nicotine, Flavoring  Substance and Sexual Activity   Alcohol use: Yes    Alcohol/week: 2.0 standard drinks of alcohol    Types: 2 Glasses of wine per week    Comment: socially wine   Drug use: No   Sexual activity: Not Currently    Birth control/protection: Other-see comments

## 2022-02-17 ENCOUNTER — Ambulatory Visit (INDEPENDENT_AMBULATORY_CARE_PROVIDER_SITE_OTHER): Payer: BC Managed Care – PPO

## 2022-02-17 ENCOUNTER — Encounter: Payer: Self-pay | Admitting: Physician Assistant

## 2022-02-17 ENCOUNTER — Ambulatory Visit (INDEPENDENT_AMBULATORY_CARE_PROVIDER_SITE_OTHER): Payer: BC Managed Care – PPO | Admitting: Physician Assistant

## 2022-02-17 DIAGNOSIS — M25522 Pain in left elbow: Secondary | ICD-10-CM

## 2022-02-17 NOTE — Progress Notes (Signed)
Office Visit Note   Patient: Mary Gonzales           Date of Birth: October 28, 2000           MRN: [id number] Visit Date: 02/17/2022              Requested by: Tally Joe, MD [phone] [address] [place],  [place] [address] PCP: Tally Joe, MD  Chief Complaint  Patient presents with   Left Elbow - Follow-up      HPI: Mary Gonzales is a pleasant 21 year old woman who is 12 days status post falling onto her left elbow while skiing.  She was immobilized at another practice and told she had a fracture because she had a positive fat pad sign.  I last saw her and placed her in a sling she is here for repeat x-rays today.  She feels the arm is improving  Assessment & Plan: Visit Diagnoses:  1. Pain in left elbow     Plan: X-rays do indicate a nondisplaced fracture of the radial head.  She is currently 12 days since the injury.  She may begin doing range of motion but should avoid weightbearing activities.  Should follow-up for final visit in 2 weeks  Follow-Up Instructions: Return in about 2 weeks (around 03/03/2022).   Ortho Exam  Patient is alert, oriented, no adenopathy, well-dressed, normal affect, normal respiratory effort. Examination of her left arm she has no ecchymosis no swelling good strong radial pulse good grip strength.  She is able to come to full extension.  She does have pain with supination and pronation but not nearly as much as she has had in the past she is neurovascular intact  Imaging: XR Elbow Complete Left (3+View)  Result Date: 02/17/2022 Radiographs of her left elbow were reviewed today.  Effusion is all but resolved.  She has well-maintained alignment without any evidence of dislocation.  She does have a very small cortical disruption in the radial head consistent with a nondisplaced fracture.  No images are attached to the encounter.  Labs: No results found for: "HGBA1C", "ESRSEDRATE", "CRP", "LABURIC", "REPTSTATUS", "GRAMSTAIN", "CULT",  "LABORGA"   Lab Results  Component Value Date   ALBUMIN 4.6 04/19/2016    No results found for: "MG" No results found for: "VD25OH"  No results found for: "PREALBUMIN"    Latest Ref Rng & Units 12/27/2021    6:14 AM 08/06/2016   12:00 AM 04/22/2016    6:45 AM  CBC EXTENDED  WBC 4.0 - 10.5 K/uL 6.8   5.6   RBC 3.87 - 5.11 MIL/uL 4.25   4.08   Hemoglobin 12.0 - 15.0 g/dL 67.6  19.5  9.9   HCT 09.3 - 46.0 % 40.4  36.0  31.6   Platelets 150 - 400 K/uL 276   259   NEUT# 1.7 - 7.7 K/uL   2.8   Lymph# 0.7 - 4.0 K/uL   1.9      There is no height or weight on file to calculate BMI.  Orders:  Orders Placed This Encounter  Procedures   XR Elbow Complete Left (3+View)   No orders of the defined types were placed in this encounter.    Procedures: No procedures performed  Clinical Data: No additional findings.  ROS:  All other systems negative, except as noted in the HPI. Review of Systems  Objective: Vital Signs: There were no vitals taken for this visit.  Specialty Comments:  No specialty comments available.  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 No pertinent family history.  Past Surgical History:  Procedure Laterality Date   DILATION AND EVACUATION N/A 12/27/2021   Procedure: DILATATION AND EVACUATION;  Surgeon: Tyson Dense, MD;  Location: [place];  Service: Gynecology;  Laterality: N/A;   MOUTH SURGERY     with sedation - front teeth   OPERATIVE ULTRASOUND N/A 12/27/2021   Procedure: OPERATIVE ULTRASOUND;   Surgeon: Tyson Dense, MD;  Location: [place];  Service: Gynecology;  Laterality: N/A;   Social History   Occupational History   Not on file  Tobacco Use   Smoking status: Never   Smokeless tobacco: Never  Vaping Use   Vaping Use: Some days   Substances: Nicotine, Flavoring  Substance and Sexual Activity   Alcohol use: Yes    Alcohol/week: 2.0 standard drinks of alcohol    Types: 2 Glasses of wine per week    Comment: socially wine   Drug use: No   Sexual activity: Not Currently    Birth control/protection: Other-see comments

## 2022-03-03 ENCOUNTER — Ambulatory Visit (INDEPENDENT_AMBULATORY_CARE_PROVIDER_SITE_OTHER): Payer: BC Managed Care – PPO | Admitting: Physician Assistant

## 2022-03-03 ENCOUNTER — Encounter: Payer: Self-pay | Admitting: Physician Assistant

## 2022-03-03 DIAGNOSIS — M25522 Pain in left elbow: Secondary | ICD-10-CM

## 2022-03-03 NOTE — Progress Notes (Signed)
Office Visit Note   Patient: Mary Gonzales           Date of Birth: [birth year]           MRN: [id number] Visit Date: 03/03/2022              Requested by: Antony Contras, MD [place],  [place] [address] PCP: Antony Contras, MD  No chief complaint on file.     HPI: Luccia comes in today for follow-up on her left radial head fracture.  Although fracture was not significantly evident she did have a positive sail sign.  At her last visit I asked that she begin working on range of motion.  She comes in today and says the pain has decreased.  She feels she is doing better  Assessment & Plan: Visit Diagnoses:  1. Pain in left elbow     Plan: Patient is doing well from a pain standpoint however she does have some limits in her motion which is concerning to me.  She is hypermobile on her unaffected elbow.  She is lacking 15 to 20 degrees of full extension and missing about 10 degrees of flexion compared with her unaffected arm.  I showed her some exercises to do.  I would like her to begin working with occupational therapy to improve her motion.  Also needs some improvement on supination and pronation  Follow-Up Instructions: No follow-ups on file.   Ortho Exam  Patient is alert, oriented, no adenopathy, well-dressed, normal affect, normal respiratory effort. No swelling no erythema noted tenderness to palpation over the elbow.  She does have stiffness with extension and with flexion.  She is lacking just a small amount of pronation and supination.  She is neurovascularly intact good grip strength  Imaging: No results found. No images are attached to the encounter.  Labs: No results found for: "HGBA1C", "ESRSEDRATE", "CRP", "LABURIC", "REPTSTATUS", "GRAMSTAIN", "CULT", "LABORGA"   Lab Results  Component Value Date   ALBUMIN 4.6 04/19/2016    No results found for: "MG" No results found for: "VD25OH"  No results found for: "PREALBUMIN"    Latest Ref  Rng & Units 12/27/2021    6:14 AM 08/06/2016   12:00 AM 04/22/2016    6:45 AM  CBC EXTENDED  WBC 4.0 - 10.5 K/uL 6.8   5.6   RBC 3.87 - 5.11 MIL/uL 4.25   4.08   Hemoglobin 12.0 - 15.0 g/dL 13.1  12.2  9.9   HCT 36.0 - 46.0 % 40.4  36.0  31.6   Platelets 150 - 400 K/uL 276   259   NEUT# 1.7 - 7.7 K/uL   2.8   Lymph# 0.7 - 4.0 K/uL   1.9      There is no height or weight on file to calculate BMI.  Orders:  Orders Placed This Encounter  Procedures   Ambulatory referral to Occupational Therapy   No orders of the defined types were placed in this encounter.    Procedures: No procedures performed  Clinical Data: No additional findings.  ROS:  All other systems negative, except as noted in the HPI. Review of Systems  Objective: Vital Signs: There were no vitals taken for this visit.  Specialty Comments:  No specialty comments available.  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  Past Surgical History:  Procedure Laterality Date   DILATION AND EVACUATION N/A 12/27/2021   Procedure: DILATATION AND EVACUATION;  Surgeon: Tyson Dense, MD;  Location: [facility];  Service: Gynecology;  Laterality: N/A;   MOUTH SURGERY     with sedation - front teeth   OPERATIVE ULTRASOUND N/A 12/27/2021   Procedure: OPERATIVE ULTRASOUND;  Surgeon: Tyson Dense, MD;  Location: [facility];  Service: Gynecology;  Laterality: N/A;   Social History   Occupational History   Not on file  Tobacco Use   Smoking status: Never   Smokeless  tobacco: Never  Vaping Use   Vaping Use: Some days   Substances: Nicotine, Flavoring  Substance and Sexual Activity   Alcohol use: Yes    Alcohol/week: 2.0 standard drinks of alcohol    Types: 2 Glasses of wine per week    Comment: socially wine   Drug use: No   Sexual activity: Not Currently    Birth control/protection: Other-see comments

## 2022-03-11 NOTE — Therapy (Incomplete)
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   Past Surgical History:  Procedure Laterality Date   DILATION AND EVACUATION N/A 12/27/2021   Procedure: DILATATION AND EVACUATION;  Surgeon: Tyson Dense, MD;  Location: [facility];  Service: Gynecology;  Laterality: N/A;   MOUTH SURGERY     with sedation - front teeth   OPERATIVE ULTRASOUND N/A 12/27/2021   Procedure: OPERATIVE ULTRASOUND;  Surgeon: Tyson Dense, MD;  Location: [facility];  Service: Gynecology;  Laterality: N/A;   Patient Active Problem List   Diagnosis Date Noted   Pain in left elbow 02/10/2022   Major depressive disorder, single episode, severe without psychosis ([facility]) 04/20/2016   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  Rationale for Evaluation and Treatment: Rehabilitation  SUBJECTIVE:   SUBJECTIVE STATEMENT: She is ***. She states ***.    PERTINENT HISTORY: 5 weeks post radial head fx now. Per provider notes: "Elbow stiffness after radial head fracture "   PRECAUTIONS: {Therapy precautions:24002}  WEIGHT BEARING RESTRICTIONS: {Yes ***/No:24003}  PAIN:  Are you having pain? *** in *** Rating: ***/10 at rest now, up to ***/10 at worst in past week   FALLS: Has patient fallen in last 6 months? {fallsyesno:27318}  LIVING ENVIRONMENT: Lives with: {OP[facility] lives with:25569::"lives with their family"} Lives in: {Lives  in:25570} Stairs: {opstairs:27293} Has following equipment at home: {Assistive devices:23999}  PLOF: {PLOF:24004}  PATIENT GOALS: ***  OBJECTIVE: (All objective assessments below are from initial evaluation on: *** unless otherwise specified.)    HAND DOMINANCE: Right ***  ADLs: Overall ADLs: States decreased ability to grab, hold household objects, pain and inability to open containers, perform FMS tasks (manipulate fasteners on clothing), mild to moderate bathing problems as well. ***   FUNCTIONAL OUTCOME MEASURES: Eval: Quck DASH ***% impairment today  (Higher % Score  =  More Impairment)    Patient Specific Functional Scale: *** (***, ***, ***)  (Higher Score  =  Better Ability for the Selected Tasks)     Patient Rated Wrist Evaluation (PRWE): Pain: ***/50; Function: ***/50; Total Score: ***/100 (Higher Score  =  More Pain and/or Debility)    UPPER EXTREMITY ROM     Shoulder to Wrist AROM Right eval Left eval  Shoulder flexion    Shoulder abduction    Shoulder extension    Shoulder internal rotation    Shoulder external rotation    Elbow flexion    Elbow extension    Forearm supination    Forearm pronation     Wrist flexion    Wrist extension    Wrist ulnar deviation    Wrist radial deviation    Functional dart thrower's motion (F-DTM) in ulnar flexion    F-DTM in radial extension     (Blank rows = not tested)   Hand AROM Right eval Left eval  Full Fist Ability (or Gap to Distal Palmar Crease)    Thumb Opposition  (Kapandji Scale)  Thumb MCP (0-60)    Thumb IP (0-80)    Thumb Radial Abduction Span     Thumb Palmar Abduction Span     Index MCP (0-90)     Index PIP (0-100)     Index DIP (0-70)      Long MCP (0-90)      Long PIP (0-100)      Long DIP (0-70)      Ring MCP (0-90)      Ring PIP (0-100)      Ring DIP (0-70)      Little MCP (0-90)      Little PIP (0-100)      Little DIP (0-70)      (Blank rows = not tested)   UPPER EXTREMITY  MMT:    Eval: *** NT at eval due to recent and still healing injuries. Will be tested when appropriate.   MMT Right TBD Left TBD  Shoulder flexion    Shoulder abduction    Shoulder adduction    Shoulder extension    Shoulder internal rotation    Shoulder external rotation    Middle trapezius    Lower trapezius    Elbow flexion    Elbow extension    Forearm supination    Forearm pronation    Wrist flexion    Wrist extension    Wrist ulnar deviation    Wrist radial deviation    (Blank rows = not tested)  HAND FUNCTION: Eval: Observed weakness in affected hand.  Grip strength Right: *** lbs, Left: *** lbs   COORDINATION: Eval: Observed coordination impairments with affected hand. Box and Blocks Test: *** Blocks today (*** is [facility]); 9 Hole Peg Test Right: ***sec, Left: *** sec (*** sec is WFL)   SENSATION: Eval: *** Light touch intact today, though diminished around sx area    EDEMA:   Eval: *** Mildly swollen in hand and wrist today, ***cm circumferentially around ***  COGNITION: Eval: Overall cognitive status: WFL for evaluation today ***  OBSERVATIONS:   Eval: ***   TODAY'S TREATMENT:  Post-evaluation treatment: ***    PATIENT EDUCATION: Education details: See tx section above for details  Person educated: Patient Education method: Veterinary surgeon, Teach back, Handouts  Education comprehension: States and demonstrates understanding, Additional Education required    HOME EXERCISE PROGRAM: See tx section above for details    GOALS: Goals reviewed with patient? Yes   SHORT TERM GOALS: (STG required if POC>30 days) Target Date: ***  Pt will obtain protective, custom orthotic. Goal status: TBD/PRN,  MET ***  2.  Pt will demo/state understanding of initial HEP to improve pain levels and prerequisite motion. Goal status: INITIAL   LONG TERM GOALS: Target Date: ***  Pt will improve functional ability by decreased impairment per Quick DASH / PSFS  / PRWE assessment from *** to *** or better, for better quality of life. Goal status: INITIAL  2.  Pt will improve grip strength in *** hand from ***lbs to at least ***lbs for functional use at home and in IADLs. Goal status: INITIAL  3.  Pt will improve A/ROM in *** from *** to at least ***, to have functional motion for tasks like reach and grasp.  Goal status: INITIAL  4.  Pt will improve strength in *** from *** MMT to at least *** MMT to have increased functional ability to carry out selfcare and higher-level homecare tasks with no difficulty. Goal status: INITIAL  5.  Pt will improve coordination  skills in ***, as seen by better score on *** testing to have increased functional ability to carry out fine motor tasks (fasteners, etc.) and more complex, coordinated IADLs (meal prep, sports, etc.).  Goal status: INITIAL  6.  Pt will decrease pain at worst from ***/10 to ***/10 or better to have better sleep and occupational participation in daily roles. Goal status: INITIAL  ASSESSMENT:  CLINICAL IMPRESSION: Patient is a 22 y.o. female who was seen today for occupational therapy evaluation for left elbow pain and stiffness after fracture with decreased function.  She will benefit from outpatient occupational therapy to improve function and quality of life.   PERFORMANCE DEFICITS: in functional skills including {OT physical skills:25468}, cognitive skills including {OT cognitive skills:25469}, and psychosocial skills including {OT psychosocial skills:25470}.   IMPAIRMENTS: are limiting patient from {OT performance deficits:25471}.   COMORBIDITIES: {Comorbidities:25485} that affects occupational performance. Patient will benefit from skilled OT to address above impairments and improve overall function.  MODIFICATION OR ASSISTANCE TO COMPLETE EVALUATION: {OT modification:25474}  OT OCCUPATIONAL PROFILE AND HISTORY: {OT PROFILE AND HISTORY:25484}  CLINICAL DECISION MAKING: {OT  CDM:25475}  REHAB POTENTIAL: {rehabpotential:25112}  EVALUATION COMPLEXITY: {Evaluation complexity:25115}      PLAN:  OT FREQUENCY: {rehab frequency:25116}  OT DURATION: {rehab duration:25117}  PLANNED INTERVENTIONS: {OT Interventions:25467}  RECOMMENDED OTHER SERVICES: ***  CONSULTED AND AGREED WITH PLAN OF CARE: {QIO:96295}  PLAN FOR NEXT SESSION: ***  Fannie Knee, OTR/L, CHT 03/11/2022, 8:49 AM

## 2022-03-15 ENCOUNTER — Encounter: Payer: BC Managed Care – PPO | Admitting: Rehabilitative and Restorative Service Providers"

## 2022-04-01 ENCOUNTER — Other Ambulatory Visit (HOSPITAL_COMMUNITY): Payer: Self-pay

## 2022-04-01 MED ORDER — LISDEXAMFETAMINE DIMESYLATE 40 MG PO CAPS
40.0000 mg | ORAL_CAPSULE | Freq: Every morning | ORAL | 0 refills | Status: AC
  Filled 2022-04-01: qty 30, 30d supply, fill #0

## 2022-04-05 ENCOUNTER — Ambulatory Visit: Payer: BC Managed Care – PPO | Admitting: Physician Assistant

## 2022-04-12 ENCOUNTER — Other Ambulatory Visit (HOSPITAL_COMMUNITY): Payer: Self-pay

## 2022-04-12 MED ORDER — AMPHETAMINE-DEXTROAMPHETAMINE 10 MG PO TABS
10.0000 mg | ORAL_TABLET | Freq: Every day | ORAL | 0 refills | Status: DC
  Filled 2022-05-10: qty 30, 30d supply, fill #0

## 2022-04-12 MED ORDER — AMPHETAMINE-DEXTROAMPHETAMINE 10 MG PO TABS
10.0000 mg | ORAL_TABLET | Freq: Every day | ORAL | 0 refills | Status: AC
  Filled 2022-04-12: qty 30, 30d supply, fill #0

## 2022-05-09 ENCOUNTER — Other Ambulatory Visit (HOSPITAL_COMMUNITY): Payer: Self-pay

## 2022-05-10 ENCOUNTER — Other Ambulatory Visit (HOSPITAL_COMMUNITY): Payer: Self-pay

## 2022-05-10 ENCOUNTER — Other Ambulatory Visit: Payer: Self-pay

## 2022-05-11 ENCOUNTER — Other Ambulatory Visit (HOSPITAL_COMMUNITY): Payer: Self-pay

## 2022-05-11 MED ORDER — LISDEXAMFETAMINE DIMESYLATE 40 MG PO CAPS
40.0000 mg | ORAL_CAPSULE | Freq: Every morning | ORAL | 0 refills | Status: AC
  Filled 2022-05-11 – 2022-05-13 (×3): qty 30, 30d supply, fill #0

## 2022-05-12 ENCOUNTER — Other Ambulatory Visit (HOSPITAL_COMMUNITY): Payer: Self-pay

## 2022-05-13 ENCOUNTER — Other Ambulatory Visit (HOSPITAL_COMMUNITY): Payer: Self-pay

## 2022-05-26 ENCOUNTER — Other Ambulatory Visit (HOSPITAL_COMMUNITY): Payer: Self-pay

## 2022-06-20 ENCOUNTER — Other Ambulatory Visit (HOSPITAL_COMMUNITY): Payer: Self-pay

## 2022-06-21 ENCOUNTER — Other Ambulatory Visit (HOSPITAL_COMMUNITY): Payer: Self-pay

## 2022-06-24 ENCOUNTER — Other Ambulatory Visit (HOSPITAL_COMMUNITY): Payer: Self-pay

## 2022-06-24 MED ORDER — LISDEXAMFETAMINE DIMESYLATE 40 MG PO CAPS
ORAL_CAPSULE | ORAL | 0 refills | Status: AC
  Filled 2022-06-24: qty 30, 30d supply, fill #0

## 2022-07-19 ENCOUNTER — Other Ambulatory Visit (HOSPITAL_COMMUNITY): Payer: Self-pay

## 2022-07-19 ENCOUNTER — Other Ambulatory Visit: Payer: Self-pay

## 2022-07-19 MED ORDER — AMPHETAMINE-DEXTROAMPHETAMINE 10 MG PO TABS
10.0000 mg | ORAL_TABLET | Freq: Every day | ORAL | 0 refills | Status: AC
  Filled 2022-07-19: qty 30, 30d supply, fill #0

## 2022-07-19 MED ORDER — LISDEXAMFETAMINE DIMESYLATE 50 MG PO CAPS
50.0000 mg | ORAL_CAPSULE | Freq: Every morning | ORAL | 0 refills | Status: AC
  Filled 2022-07-19: qty 30, 30d supply, fill #0

## 2022-07-29 ENCOUNTER — Other Ambulatory Visit (HOSPITAL_COMMUNITY): Payer: Self-pay

## 2022-09-22 ENCOUNTER — Ambulatory Visit: Payer: BC Managed Care – PPO | Admitting: Physician Assistant

## 2023-04-05 ENCOUNTER — Other Ambulatory Visit (HOSPITAL_COMMUNITY): Payer: Self-pay

## 2023-04-05 MED ORDER — LISDEXAMFETAMINE DIMESYLATE 40 MG PO CAPS
40.0000 mg | ORAL_CAPSULE | Freq: Every morning | ORAL | 0 refills | Status: DC
  Filled 2023-08-06: qty 30, 30d supply, fill #0

## 2023-04-05 MED ORDER — LISDEXAMFETAMINE DIMESYLATE 40 MG PO CAPS
40.0000 mg | ORAL_CAPSULE | Freq: Every morning | ORAL | 0 refills | Status: AC
  Filled 2023-05-25: qty 30, 30d supply, fill #0

## 2023-04-05 MED ORDER — LISDEXAMFETAMINE DIMESYLATE 40 MG PO CAPS
40.0000 mg | ORAL_CAPSULE | ORAL | 0 refills | Status: AC
  Filled 2023-04-05 – 2023-04-18 (×2): qty 30, 30d supply, fill #0

## 2023-04-06 ENCOUNTER — Other Ambulatory Visit (HOSPITAL_COMMUNITY): Payer: Self-pay

## 2023-04-15 ENCOUNTER — Other Ambulatory Visit (HOSPITAL_COMMUNITY): Payer: Self-pay

## 2023-04-18 ENCOUNTER — Other Ambulatory Visit (HOSPITAL_COMMUNITY): Payer: Self-pay

## 2023-05-25 ENCOUNTER — Other Ambulatory Visit (HOSPITAL_COMMUNITY): Payer: Self-pay

## 2023-08-07 ENCOUNTER — Other Ambulatory Visit (HOSPITAL_COMMUNITY): Payer: Self-pay

## 2023-08-18 ENCOUNTER — Other Ambulatory Visit (HOSPITAL_COMMUNITY): Payer: Self-pay

## 2023-08-18 MED ORDER — LISDEXAMFETAMINE DIMESYLATE 40 MG PO CAPS
40.0000 mg | ORAL_CAPSULE | Freq: Every morning | ORAL | 0 refills | Status: AC
  Filled 2023-08-18: qty 30, 30d supply, fill #0

## 2023-08-18 MED ORDER — LISDEXAMFETAMINE DIMESYLATE 40 MG PO CAPS
40.0000 mg | ORAL_CAPSULE | Freq: Every morning | ORAL | 0 refills | Status: AC
  Filled 2023-08-18 – 2023-09-19 (×2): qty 30, 30d supply, fill #0

## 2023-08-18 MED ORDER — LISDEXAMFETAMINE DIMESYLATE 40 MG PO CAPS
40.0000 mg | ORAL_CAPSULE | Freq: Every morning | ORAL | 0 refills | Status: AC
  Filled 2023-08-18 – 2024-01-24 (×2): qty 30, 30d supply, fill #0

## 2023-08-23 ENCOUNTER — Ambulatory Visit: Payer: Self-pay | Admitting: Podiatry

## 2023-09-19 ENCOUNTER — Other Ambulatory Visit (HOSPITAL_COMMUNITY): Payer: Self-pay

## 2023-10-31 ENCOUNTER — Other Ambulatory Visit (HOSPITAL_COMMUNITY): Payer: Self-pay

## 2023-10-31 MED ORDER — LISDEXAMFETAMINE DIMESYLATE 40 MG PO CAPS
40.0000 mg | ORAL_CAPSULE | Freq: Every day | ORAL | 0 refills | Status: AC
  Filled 2023-10-31: qty 30, 30d supply, fill #0

## 2023-10-31 MED ORDER — LISDEXAMFETAMINE DIMESYLATE 40 MG PO CAPS
40.0000 mg | ORAL_CAPSULE | Freq: Every morning | ORAL | 0 refills | Status: AC
  Filled 2023-12-14: qty 30, 30d supply, fill #0

## 2023-10-31 MED ORDER — LISDEXAMFETAMINE DIMESYLATE 40 MG PO CAPS: 40.0000 mg | ORAL_CAPSULE | Freq: Every morning | ORAL | 0 refills | Status: AC

## 2023-12-14 ENCOUNTER — Other Ambulatory Visit (HOSPITAL_COMMUNITY): Payer: Self-pay

## 2023-12-15 ENCOUNTER — Other Ambulatory Visit (HOSPITAL_COMMUNITY): Payer: Self-pay

## 2023-12-15 MED ORDER — SERTRALINE HCL 50 MG PO TABS
50.0000 mg | ORAL_TABLET | Freq: Every day | ORAL | 0 refills | Status: DC
  Filled 2023-12-15: qty 30, 30d supply, fill #0

## 2024-01-12 ENCOUNTER — Other Ambulatory Visit (HOSPITAL_COMMUNITY): Payer: Self-pay

## 2024-01-12 MED ORDER — SERTRALINE HCL 50 MG PO TABS
50.0000 mg | ORAL_TABLET | Freq: Every day | ORAL | 0 refills | Status: DC
  Filled 2024-01-12 – 2024-01-23 (×2): qty 30, 30d supply, fill #0

## 2024-01-22 ENCOUNTER — Other Ambulatory Visit (HOSPITAL_COMMUNITY): Payer: Self-pay

## 2024-01-23 ENCOUNTER — Other Ambulatory Visit (HOSPITAL_COMMUNITY): Payer: Self-pay

## 2024-01-24 ENCOUNTER — Other Ambulatory Visit (HOSPITAL_COMMUNITY): Payer: Self-pay

## 2024-01-27 ENCOUNTER — Other Ambulatory Visit (HOSPITAL_COMMUNITY): Payer: Self-pay

## 2024-02-03 ENCOUNTER — Other Ambulatory Visit (HOSPITAL_COMMUNITY): Payer: Self-pay

## 2024-02-03 MED ORDER — AMPHETAMINE-DEXTROAMPHETAMINE 10 MG PO TABS
10.0000 mg | ORAL_TABLET | Freq: Every day | ORAL | 0 refills | Status: DC
  Filled 2024-02-03: qty 30, 30d supply, fill #0

## 2024-02-04 ENCOUNTER — Other Ambulatory Visit (HOSPITAL_COMMUNITY): Payer: Self-pay

## 2024-02-06 ENCOUNTER — Other Ambulatory Visit (HOSPITAL_COMMUNITY): Payer: Self-pay

## 2024-02-06 MED ORDER — AMPHETAMINE-DEXTROAMPHETAMINE 10 MG PO TABS: 10.0000 mg | ORAL_TABLET | Freq: Every day | ORAL | 0 refills | Status: AC

## 2024-02-06 MED ORDER — LISDEXAMFETAMINE DIMESYLATE 40 MG PO CAPS: 40.0000 mg | ORAL_CAPSULE | Freq: Every morning | ORAL | 0 refills | Status: AC

## 2024-02-06 MED ORDER — LISDEXAMFETAMINE DIMESYLATE 40 MG PO CAPS
40.0000 mg | ORAL_CAPSULE | Freq: Every morning | ORAL | 0 refills | Status: AC
  Filled 2024-03-28 – 2024-03-29 (×2): qty 30, 30d supply, fill #0

## 2024-02-06 MED ORDER — AMPHETAMINE-DEXTROAMPHETAMINE 10 MG PO TABS
10.0000 mg | ORAL_TABLET | Freq: Every day | ORAL | 0 refills | Status: AC
  Filled 2024-03-05: qty 30, 30d supply, fill #0

## 2024-02-06 MED ORDER — SERTRALINE HCL 50 MG PO TABS
75.0000 mg | ORAL_TABLET | Freq: Every day | ORAL | 0 refills | Status: DC
  Filled 2024-02-21: qty 45, 30d supply, fill #0

## 2024-02-21 ENCOUNTER — Other Ambulatory Visit (HOSPITAL_COMMUNITY): Payer: Self-pay

## 2024-02-28 ENCOUNTER — Other Ambulatory Visit (HOSPITAL_COMMUNITY): Payer: Self-pay

## 2024-03-02 ENCOUNTER — Other Ambulatory Visit (HOSPITAL_COMMUNITY): Payer: Self-pay

## 2024-03-05 ENCOUNTER — Other Ambulatory Visit: Payer: Self-pay

## 2024-03-05 ENCOUNTER — Other Ambulatory Visit (HOSPITAL_COMMUNITY): Payer: Self-pay

## 2024-03-15 ENCOUNTER — Other Ambulatory Visit (HOSPITAL_COMMUNITY): Payer: Self-pay

## 2024-03-28 ENCOUNTER — Other Ambulatory Visit: Payer: Self-pay

## 2024-03-28 ENCOUNTER — Other Ambulatory Visit (HOSPITAL_COMMUNITY): Payer: Self-pay

## 2024-03-29 ENCOUNTER — Other Ambulatory Visit (HOSPITAL_COMMUNITY): Payer: Self-pay

## 2024-03-29 ENCOUNTER — Encounter: Payer: Self-pay | Admitting: Pharmacist

## 2024-03-29 ENCOUNTER — Other Ambulatory Visit: Payer: Self-pay

## 2024-03-29 MED ORDER — SERTRALINE HCL 50 MG PO TABS
50.0000 mg | ORAL_TABLET | Freq: Every day | ORAL | 1 refills | Status: AC
  Filled 2024-03-29 (×2): qty 30, 30d supply, fill #0

## 2024-04-15 ENCOUNTER — Encounter: Payer: Self-pay | Admitting: Dietician
